# Patient Record
Sex: Female | Born: 2000 | Race: Black or African American | Hispanic: No | Marital: Single | Smoking: Never smoker
Health system: Southern US, Community
[De-identification: ages and names within clinical notes are randomized; demographics above are authoritative.]

## PROBLEM LIST (undated history)

## (undated) DIAGNOSIS — J45909 Unspecified asthma, uncomplicated: Secondary | ICD-10-CM

---
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
## 2021-12-23 ENCOUNTER — Encounter (HOSPITAL_COMMUNITY): Payer: Self-pay

## 2021-12-23 ENCOUNTER — Ambulatory Visit (HOSPITAL_COMMUNITY)
Admission: EM | Admit: 2021-12-23 | Discharge: 2021-12-23 | Disposition: A | Discharge status: Home / Self Care | Payer: Medicaid Other | Attending: Internal Medicine | Admitting: Internal Medicine

## 2021-12-23 DIAGNOSIS — J028 Acute pharyngitis due to other specified organisms: Secondary | ICD-10-CM | POA: Diagnosis not present

## 2021-12-23 DIAGNOSIS — R519 Headache, unspecified: Secondary | ICD-10-CM | POA: Diagnosis present

## 2021-12-23 DIAGNOSIS — B9789 Other viral agents as the cause of diseases classified elsewhere: Secondary | ICD-10-CM | POA: Insufficient documentation

## 2021-12-23 DIAGNOSIS — U071 COVID-19: Secondary | ICD-10-CM | POA: Insufficient documentation

## 2021-12-23 DIAGNOSIS — R0981 Nasal congestion: Secondary | ICD-10-CM | POA: Diagnosis not present

## 2021-12-23 DIAGNOSIS — J029 Acute pharyngitis, unspecified: Secondary | ICD-10-CM

## 2021-12-23 LAB — RESP PANEL BY RT-PCR (FLU A&B, COVID) ARPGX2
Influenza A by PCR: NEGATIVE
Influenza B by PCR: NEGATIVE
SARS Coronavirus 2 by RT PCR: POSITIVE — AB

## 2021-12-23 NOTE — ED Triage Notes (Signed)
Pt c/o nasal congestion, slight cough, and headache since Saturday. Taking OTC meds with little relief.

## 2021-12-23 NOTE — Discharge Instructions (Signed)
Warm salt water gargle Increase oral fluid intake Continue Tylenol/Motrin as needed for pain and/or fever. Will call you with recommendations if labs are abnormal Return to urgent care if symptoms worsen.

## 2021-12-25 NOTE — ED Provider Notes (Signed)
[facility]    CSN: [id number] Arrival date & time: 12/23/21  1039      History   Chief Complaint Chief Complaint  Patient presents with   Nasal Congestion   Headache    HPI Shelly Aguirre is a 21 y.o. female comes to urgent care with nasal congestion, nonproductive cough and a headache for 5 days duration.  Patient's symptoms started insidiously and has been persistent.  She had chills at the outset of her symptoms but the chills have resolved.  She denies any febrile episodes.  No shortness of breath or wheezing.  Patient denies any sick contacts.  No shortness of breath.  No nausea, vomiting or diarrhea.  Patient denies any sick contacts.  She lives at home with her grandmother who is well with no symptoms.  She is fully vaccinated against COVID-19 virus.   HPI  Past Medical History:  Diagnosis Date   Asthma     There are no problems to display for this patient.   History reviewed. No pertinent surgical history.  OB History   No obstetric history on file.      Home Medications    Prior to Admission medications   Medication Sig Start Date End Date Taking? Authorizing Provider  albuterol (PROVENTIL HFA;VENTOLIN HFA) 108 (90 Base) MCG/ACT inhaler Inhale 2 puffs into the lungs every 4 (four) hours as needed for wheezing or shortness of breath. 01/27/18   Curatolo, Adam, DO  cetirizine (ZYRTEC) 10 MG tablet Take 1 tablet (10 mg total) by mouth daily. 09/04/14   Excell Seltzer, Adrian Blackwater, PA-C  cyclobenzaprine (FLEXERIL) 10 MG tablet Take 1 tablet (10 mg total) by mouth 2 (two) times daily as needed for muscle spasms. 11/04/14   Viviano Simas, NP  fluticasone (FLONASE) 50 MCG/ACT nasal spray Place 2 sprays into both nostrils 2 (two) times daily. Decrease to 2 sprays/nostril daily after 5 days 09/04/14   Graylon Good, PA-C  ibuprofen (ADVIL,MOTRIN) 400 MG tablet Take 1 tablet (400 mg total) by mouth every 6 (six) hours as needed for mild pain or moderate pain. 06/23/16    Everlene Farrier, PA-C  loratadine (CLARITIN) 10 MG tablet Take 1 tablet (10 mg total) by mouth daily. 07/22/14   Antony Madura, PA-C  naproxen (NAPROSYN) 500 MG tablet Take 1 tablet (500 mg total) by mouth 2 (two) times daily. 07/22/14   Antony Madura, PA-C  phenol (CHLORASEPTIC) 1.4 % LIQD Use as directed 1 spray in the mouth or throat as needed for throat irritation / pain. 07/22/14   Antony Madura, PA-C  trimethoprim-polymyxin b (POLYTRIM) ophthalmic solution Place 1 drop into the right eye every 4 (four) hours. 02/13/16   Garlon Hatchet, PA-C    Family History History reviewed. No pertinent family history.  Social History Social History   Tobacco Use   Smoking status: Never   Smokeless tobacco: Never  Substance Use Topics   Alcohol use: Never   Drug use: Never     Allergies   Patient has no known allergies.   Review of Systems Review of Systems  Constitutional: Negative.   HENT:  Positive for congestion and sore throat.   Respiratory:  Positive for cough.   Cardiovascular: Negative.   Gastrointestinal: Negative.   Genitourinary: Negative.   Neurological:  Positive for headaches.     Physical Exam Triage Vital Signs ED Triage Vitals  Enc Vitals Group     BP 12/23/21 1100 118/82     Pulse Rate 12/23/21 1100 97  Resp 12/23/21 1100 18     Temp 12/23/21 1100 98.3 F (36.8 C)     Temp Source 12/23/21 1100 Oral     SpO2 12/23/21 1100 96 %     Weight --      Height --      Head Circumference --      Peak Flow --      Pain Score 12/23/21 1101 4     Pain Loc --      Pain Edu? --      Excl. in GC? --    No data found.  Updated Vital Signs BP 118/82 (BP Location: Left Arm)   Pulse 97   Temp 98.3 F (36.8 C) (Oral)   Resp 18   SpO2 96%   Visual Acuity Right Eye Distance:   Left Eye Distance:   Bilateral Distance:    Right Eye Near:   Left Eye Near:    Bilateral Near:     Physical Exam Vitals and nursing note reviewed.  Constitutional:      General:  She is not in acute distress.    Appearance: She is not ill-appearing.  HENT:     Mouth/Throat:     Mouth: Mucous membranes are moist.     Comments: Mild pharyngeal erythema Cardiovascular:     Rate and Rhythm: Normal rate and regular rhythm.  Pulmonary:     Effort: Pulmonary effort is normal.     Breath sounds: Normal breath sounds.  Abdominal:     General: Bowel sounds are normal.     Palpations: Abdomen is soft.  Musculoskeletal:     Cervical back: Normal range of motion and neck supple.  Neurological:     Mental Status: She is alert.     GCS: GCS eye subscore is 4. GCS verbal subscore is 5. GCS motor subscore is 6.      UC Treatments / Results  Labs (all labs ordered are listed, but only abnormal results are displayed) Labs Reviewed  RESP PANEL BY RT-PCR (FLU A&B, COVID) ARPGX2 - Abnormal; Notable for the following components:      Result Value   SARS Coronavirus 2 by RT PCR POSITIVE (*)    All other components within normal limits    EKG   Radiology No results found.  Procedures Procedures (including critical care time)  Medications Ordered in UC Medications - No data to display  Initial Impression / Assessment and Plan / UC Course  I have reviewed the triage vital signs and the nursing notes.  Pertinent labs & imaging results that were available during my care of the patient were reviewed by me and considered in my medical decision making (see chart for details).     1.  Acute viral pharyngitis: Also, warm, no movements of fluids Flu A/B, COVID-19 test has been sent Patient is advised to quarantine until lab results are available Patient will be exposed, outside of window for thyroid treatment. Supportive care advised Return precautions given We will contact patient with recommendations if labs are abnormal. Final Clinical Impressions(s) / UC Diagnoses   Final diagnoses:  Acute viral pharyngitis     Discharge Instructions      Warm salt  water gargle Increase oral fluid intake Continue Tylenol/Motrin as needed for pain and/or fever. Will call you with recommendations if labs are abnormal Return to urgent care if symptoms worsen.   ED Prescriptions   None    PDMP not reviewed this encounter.   Blanche Gallien,  Britta Mccreedy, MD 12/25/21 1616

## 2022-09-19 ENCOUNTER — Emergency Department (HOSPITAL_COMMUNITY)
Admission: EM | Admit: 2022-09-19 | Discharge: 2022-09-19 | Disposition: A | Discharge status: Home / Self Care | Payer: No Typology Code available for payment source | Attending: Emergency Medicine | Admitting: Emergency Medicine

## 2022-09-19 ENCOUNTER — Other Ambulatory Visit: Payer: Self-pay

## 2022-09-19 ENCOUNTER — Encounter (HOSPITAL_COMMUNITY): Payer: Self-pay

## 2022-09-19 DIAGNOSIS — G44319 Acute post-traumatic headache, not intractable: Secondary | ICD-10-CM | POA: Diagnosis not present

## 2022-09-19 DIAGNOSIS — J45909 Unspecified asthma, uncomplicated: Secondary | ICD-10-CM | POA: Insufficient documentation

## 2022-09-19 DIAGNOSIS — Y92410 Unspecified street and highway as the place of occurrence of the external cause: Secondary | ICD-10-CM | POA: Insufficient documentation
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
## 2022-09-22 ENCOUNTER — Ambulatory Visit
Admission: EM | Admit: 2022-09-22 | Discharge: 2022-09-22 | Disposition: A | Discharge status: Home / Self Care | Payer: Medicaid Other | Attending: Nurse Practitioner | Admitting: Nurse Practitioner

## 2022-09-22 DIAGNOSIS — J069 Acute upper respiratory infection, unspecified: Secondary | ICD-10-CM | POA: Diagnosis not present

## 2022-09-22 DIAGNOSIS — U071 COVID-19: Secondary | ICD-10-CM | POA: Insufficient documentation

## 2022-09-22 DIAGNOSIS — J029 Acute pharyngitis, unspecified: Secondary | ICD-10-CM

## 2022-09-22 LAB — POCT RAPID STREP A (OFFICE): Rapid Strep A Screen: NEGATIVE

## 2022-09-22 MED ORDER — PROMETHAZINE-DM 6.25-15 MG/5ML PO SYRP: 5.0000 mL | ORAL_SOLUTION | Freq: Four times a day (QID) | ORAL | 0 refills | Status: AC | PRN

## 2022-09-22 NOTE — Discharge Instructions (Signed)
Your rapid strep was negative in clinic.  The clinic send your throat swab for culture and contact you if this is positive Promethazine DM as needed for cough.  Please of this medication can make you drowsy.  Do not drink alcohol or drive while on this medication Salt gargles and warm liquids Rest and fluids Follow-up with your PCP if your symptoms do not improve Please go to the emergency room if you develop any worsening symptoms

## 2022-09-22 NOTE — ED Provider Notes (Signed)
[facility]    CSN: [id number] Arrival date & time: 09/22/22  1539      History   Chief Complaint Chief Complaint  Patient presents with   Generalized Body Aches   Cough   Nasal Congestion   Sore Throat    HPI Shelly Aguirre is a 22 y.o. female  presents for evaluation of URI symptoms for 2 days. Patient reports associated symptoms of sore throat that began 4 days ago and then 2 days ago developed cough, congestion, runny nose, headaches and bodyaches. Denies N/V/D, fevers, ear pain, shortness of breath. Patient does have a hx of asthma.  Denies any wheezing or shortness of breath.  Has an albuterol inhaler but has not needed to use since symptom onset.  No smoking.  States friend had strep throat.  Pt has taken ibuprofen OTC for symptoms. Pt has no other concerns at this time.    Cough Associated symptoms: headaches, myalgias and sore throat   Sore Throat Associated symptoms include headaches.    Past Medical History:  Diagnosis Date   Asthma     There are no problems to display for this patient.   History reviewed. No pertinent surgical history.  OB History   No obstetric history on file.      Home Medications    Prior to Admission medications   Medication Sig Start Date End Date Taking? Authorizing Provider  promethazine-dextromethorphan (PROMETHAZINE-DM) 6.25-15 MG/5ML syrup Take 5 mLs by mouth 4 (four) times daily as needed for cough. 09/22/22  Yes Radford Pax, NP  albuterol (PROVENTIL HFA;VENTOLIN HFA) 108 (90 Base) MCG/ACT inhaler Inhale 2 puffs into the lungs every 4 (four) hours as needed for wheezing or shortness of breath. 01/27/18   Curatolo, Adam, DO  cetirizine (ZYRTEC) 10 MG tablet Take 1 tablet (10 mg total) by mouth daily. 09/04/14   Excell Seltzer, Adrian Blackwater, PA-C  cyclobenzaprine (FLEXERIL) 10 MG tablet Take 1 tablet (10 mg total) by mouth 2 (two) times daily as needed for muscle spasms. 11/04/14   Viviano Simas, NP  fluticasone (FLONASE) 50  MCG/ACT nasal spray Place 2 sprays into both nostrils 2 (two) times daily. Decrease to 2 sprays/nostril daily after 5 days 09/04/14   Graylon Good, PA-C  ibuprofen (ADVIL,MOTRIN) 400 MG tablet Take 1 tablet (400 mg total) by mouth every 6 (six) hours as needed for mild pain or moderate pain. 06/23/16   Everlene Farrier, PA-C  loratadine (CLARITIN) 10 MG tablet Take 1 tablet (10 mg total) by mouth daily. 07/22/14   Antony Madura, PA-C  naproxen (NAPROSYN) 500 MG tablet Take 1 tablet (500 mg total) by mouth 2 (two) times daily. 07/22/14   Antony Madura, PA-C  phenol (CHLORASEPTIC) 1.4 % LIQD Use as directed 1 spray in the mouth or throat as needed for throat irritation / pain. 07/22/14   Antony Madura, PA-C  trimethoprim-polymyxin b (POLYTRIM) ophthalmic solution Place 1 drop into the right eye every 4 (four) hours. 02/13/16   Garlon Hatchet, PA-C    Family History History reviewed. No pertinent family history.  Social History Social History   Tobacco Use   Smoking status: Never   Smokeless tobacco: Never  Substance Use Topics   Alcohol use: Never   Drug use: Never     Allergies   Patient has no known allergies.   Review of Systems Review of Systems  HENT:  Positive for congestion and sore throat.   Respiratory:  Positive for cough.   Musculoskeletal:  Positive for myalgias.  Neurological:  Positive for headaches.     Physical Exam Triage Vital Signs ED Triage Vitals  Enc Vitals Group     BP 09/22/22 1624 109/72     Pulse Rate 09/22/22 1624 (!) 107     Resp 09/22/22 1624 17     Temp 09/22/22 1624 99.4 F (37.4 C)     Temp Source 09/22/22 1624 Oral     SpO2 09/22/22 1624 96 %     Weight --      Height --      Head Circumference --      Peak Flow --      Pain Score 09/22/22 1623 2     Pain Loc --      Pain Edu? --      Excl. in GC? --    No data found.  Updated Vital Signs BP 109/72 (BP Location: Right Arm)   Pulse (!) 107   Temp 99.4 F (37.4 C) (Oral)   Resp 17    LMP  (LMP Unknown)   SpO2 96%   Visual Acuity Right Eye Distance:   Left Eye Distance:   Bilateral Distance:    Right Eye Near:   Left Eye Near:    Bilateral Near:     Physical Exam Vitals and nursing note reviewed.  Constitutional:      General: She is not in acute distress.    Appearance: She is well-developed. She is not ill-appearing.  HENT:     Head: Normocephalic and atraumatic.     Right Ear: Tympanic membrane and ear canal normal.     Left Ear: Tympanic membrane and ear canal normal.     Nose: Congestion present.     Mouth/Throat:     Mouth: Mucous membranes are moist.     Pharynx: Oropharynx is clear. Uvula midline. Posterior oropharyngeal erythema present.     Tonsils: No tonsillar exudate or tonsillar abscesses.  Eyes:     Conjunctiva/sclera: Conjunctivae normal.     Pupils: Pupils are equal, round, and reactive to light.  Cardiovascular:     Rate and Rhythm: Normal rate and regular rhythm.     Heart sounds: Normal heart sounds.  Pulmonary:     Effort: Pulmonary effort is normal.     Breath sounds: Normal breath sounds.  Musculoskeletal:     Cervical back: Normal range of motion and neck supple.  Lymphadenopathy:     Cervical: No cervical adenopathy.  Skin:    General: Skin is warm and dry.  Neurological:     General: No focal deficit present.     Mental Status: She is alert and oriented to person, place, and time.  Psychiatric:        Mood and Affect: Mood normal.        Behavior: Behavior normal.      UC Treatments / Results  Labs (all labs ordered are listed, but only abnormal results are displayed) Labs Reviewed  SARS CORONAVIRUS 2 (TAT 6-24 HRS)  CULTURE, GROUP A STREP [facility])  POCT RAPID STREP A (OFFICE)    EKG   Radiology No results found.  Procedures Procedures (including critical care time)  Medications Ordered in UC Medications - No data to display  Initial Impression / Assessment and Plan / UC Course  I have reviewed the  triage vital signs and the nursing notes.  Pertinent labs & imaging results that were available during my care of the patient were reviewed by me  and considered in my medical decision making (see chart for details).     Reviewed exam and symptoms.  No red flags. Negative rapid strep, will culture.  PCR and will contact if positive Discussed viral illness and symptomatic treatment Promethazine DM as needed for cough.  Side effect profile reviewed PCP follow-up if symptoms do not improve ER precautions reviewed and patient verbalized understanding Final Clinical Impressions(s) / UC Diagnoses   Final diagnoses:  Sore throat  Viral upper respiratory illness     Discharge Instructions      Your rapid strep was negative in clinic.  The clinic send your throat swab for culture and contact you if this is positive Promethazine DM as needed for cough.  Please of this medication can make you drowsy.  Do not drink alcohol or drive while on this medication Salt gargles and warm liquids Rest and fluids Follow-up with your PCP if your symptoms do not improve Please go to the emergency room if you develop any worsening symptoms     ED Prescriptions     Medication Sig Dispense Auth. Provider   promethazine-dextromethorphan (PROMETHAZINE-DM) 6.25-15 MG/5ML syrup Take 5 mLs by mouth 4 (four) times daily as needed for cough. 118 mL Radford Pax, NP      PDMP not reviewed this encounter.   Radford Pax, NP 09/22/22 [phone]

## 2022-09-22 NOTE — ED Triage Notes (Signed)
Pt presents with sore throat x 4 days. Sttaes she has body aches, cough, runny nose and headaches.

## 2022-09-23 LAB — CULTURE, GROUP A STREP (THRC)

## 2022-09-23 LAB — SARS CORONAVIRUS 2 (TAT 6-24 HRS): SARS Coronavirus 2: POSITIVE — AB

## 2022-09-24 LAB — CULTURE, GROUP A STREP (THRC)

## 2022-09-25 LAB — CULTURE, GROUP A STREP (THRC)
